# Patient Record
Sex: Female | Born: 1995 | Race: Black or African American | Hispanic: No | Marital: Single | Smoking: Never smoker
Health system: Southern US, Community
[De-identification: ages and names within clinical notes are randomized; demographics above are authoritative.]

## PROBLEM LIST (undated history)

## (undated) DIAGNOSIS — Z91013 Allergy to seafood: Secondary | ICD-10-CM

## (undated) DIAGNOSIS — J302 Other seasonal allergic rhinitis: Secondary | ICD-10-CM

## (undated) DIAGNOSIS — M940 Chondrocostal junction syndrome [Tietze]: Secondary | ICD-10-CM

## (undated) DIAGNOSIS — F40243 Fear of flying: Secondary | ICD-10-CM

## (undated) HISTORY — DX: Allergy to seafood: Z91.013

## (undated) HISTORY — DX: Chondrocostal junction syndrome (tietze): M94.0

## (undated) HISTORY — DX: Fear of flying: F40.243

## (undated) HISTORY — DX: Other seasonal allergic rhinitis: J30.2

---
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
## 2015-12-16 ENCOUNTER — Ambulatory Visit (INDEPENDENT_AMBULATORY_CARE_PROVIDER_SITE_OTHER): Payer: Managed Care, Other (non HMO) | Admitting: Family Medicine

## 2015-12-16 ENCOUNTER — Encounter: Payer: Self-pay | Admitting: Family Medicine

## 2015-12-16 VITALS — BP 130/100 | HR 82 | Wt 209.0 lb

## 2015-12-16 DIAGNOSIS — S61012A Laceration without foreign body of left thumb without damage to nail, initial encounter: Secondary | ICD-10-CM

## 2015-12-16 DIAGNOSIS — Z23 Encounter for immunization: Secondary | ICD-10-CM

## 2015-12-16 NOTE — Progress Notes (Signed)
   Subjective:    Patient ID: Danley DankerAyanna Arrick, female    DOB: May 05, 1995, 20 y.o.   MRN: [id number]  HPI She sustained a slight laceration to her left thumb around noon today. She is here for further evaluation of this. She did clean it several times with soap and water as well as peroxide.  Review of Systems     Objective:   Physical Exam The left thumb distally on the palmar surface shows a 1-1/2 cm laceration with the edges well opposed.       Assessment & Plan:  Laceration of left thumb without foreign body without damage to nail, initial encounter  Need for prophylactic vaccination with combined diphtheria-tetanus-pertussis (DTP) vaccine - Plan: Tdap vaccine greater than or equal to 7yo IM  A Band-Aid was applied. She will be given a tetanus shot. No further intervention necessary.

## 2016-02-02 ENCOUNTER — Encounter: Payer: Self-pay | Admitting: Medical

## 2016-02-02 ENCOUNTER — Ambulatory Visit (INDEPENDENT_AMBULATORY_CARE_PROVIDER_SITE_OTHER): Payer: Managed Care, Other (non HMO) | Admitting: Medical

## 2016-02-02 VITALS — BP 116/70 | HR 98 | Temp 99.0°F | Wt 209.8 lb

## 2016-02-02 DIAGNOSIS — N926 Irregular menstruation, unspecified: Secondary | ICD-10-CM | POA: Diagnosis not present

## 2016-02-02 DIAGNOSIS — J019 Acute sinusitis, unspecified: Secondary | ICD-10-CM | POA: Diagnosis not present

## 2016-02-02 DIAGNOSIS — K219 Gastro-esophageal reflux disease without esophagitis: Secondary | ICD-10-CM

## 2016-02-02 MED ORDER — FAMOTIDINE 40 MG PO TABS: 40.0000 mg | ORAL_TABLET | Freq: Every day | ORAL | 0 refills | Status: DC

## 2016-02-02 MED ORDER — AMOXICILLIN 500 MG PO TABS: ORAL_TABLET | ORAL | 0 refills | Status: DC

## 2016-02-02 NOTE — Progress Notes (Signed)
Subjective: Chief Complaint  Patient presents with  . sinus    sinus , no fever, no coughing , acid relex , has had period in 2 months   Here for several concerns.  She notes several week hx/o sinus drainage, headache.  Has some throat.  Using some dayquil and Nyquil.   +sick contact.   No fever.  No vomiting, no diarrhea.  No cough.  Nothing will come out of head.     Hasn't had period in 2 months.  Had home pregnancy test twice in October, and one in first week of November, all negative  Lately feeling reflux in throat at night when lying flat.   Using nothing for this.   Uses no particular diet discretion.   Last week was thanksgiving.     On no medications, no birth control.   Last birth control 742015.   Sexually active.   Always using condoms.   No prior pregnancy.  First started having periods age 20yo.  Since getting off birth control 2015 periods have been quite irregular.   Periods are sometimes heavy, but not lately.   No family hx/o gynecological problems.   She denies hx/o cysts of fibroids.   Last pap smear never.  No vaginal discharge.    No bowel issues.  Past Medical History:  Diagnosis Date  . Costochondritis 2014  . Seasonal allergies    pollen, mold  . Shellfish allergy    No current outpatient prescriptions on file prior to visit.   No current facility-administered medications on file prior to visit.    ROS as in subjective   Objective: BP 116/70   Pulse 98   Temp 99 F (37.2 C)   Wt 209 lb 12.8 oz (95.2 kg)   SpO2 99%   BMI 37.16 kg/m   General appearance: Alert, WD/WN, no distress                             Skin: warm, no rash                           Head: + frontal sinus tenderness,                            Eyes: conjunctiva normal, corneas clear, PERRLA                            Ears: pearly TMs, external ear canals normal                          Nose: septum midline, turbinates swollen, with erythema and clear discharge  Mouth/throat: MMM, tongue normal, mild pharyngeal erythema                           Neck: supple, no adenopathy, no thyromegaly, non tender                          Heart: RRR, normal S1, S2, no murmurs                         Lungs: CTA bilaterally, no wheezes, rales, or rhonchi    abdomen: +bs, soft, nontender, no mass, no  organomegaly Back: nontender     Assessment: Encounter Diagnoses  Name Primary?  . Acute non-recurrent sinusitis, unspecified location Yes  . Gastroesophageal reflux disease without esophagitis   . Irregular periods      Plan: Sinusitis - discussed supportive care, begin amoxicillin, can c/t dayquil/Nyquil, and if not much improved by end of the week, then recheck GERD- avoid GERD triggers, begin trial of Famotidine, and recheck if not resolved within 2 weeks Irregular periods - urine pregnancy negative.  Discussed possible causes, evaluation recommended.  She will return for physical, consideration of contraception, and eval for irregular periods (labs, exam, ultrasound)  Leena was seen today for sinus.  Diagnoses and all orders for this visit:  Acute non-recurrent sinusitis, unspecified location  Gastroesophageal reflux disease without esophagitis  Irregular periods  Other orders -     amoxicillin (AMOXIL) 500 MG tablet; 2 tablets po BID x 10 days -     famotidine (PEPCID) 40 MG tablet; Take 1 tablet (40 mg total) by mouth daily.

## 2016-02-02 NOTE — Patient Instructions (Signed)
Contraception Choices Birth control (contraception) is the use of any methods or devices to stop pregnancy from happening. Below are some methods to help avoid pregnancy. Hormonal birth control  A small tube put under the skin of the upper arm (implant). The tube can stay in place for 3 years. The implant must be taken out after 3 years.  Shots given every 3 months.  Pills taken every day.  Patches that are changed once a week.  A ring put into the vagina (vaginal ring). The ring is left in place for 3 weeks and removed for 1 week. Then, a new ring is put in the vagina.  Emergency birth control pills taken after unprotected sex (intercourse). Barrier birth control  A thin covering worn on the penis (female condom) during sex.  A soft, loose covering put into the vagina (female condom) before sex.  A rubber bowl that sits over the cervix (diaphragm). The bowl must be made for you. The bowl is put into the vagina before sex. The bowl is left in place for 6 to 8 hours after sex.  A small, soft cup that fits over the cervix (cervical cap). The cup must be made for you. The cup can be left in place for 48 hours after sex.  A sponge that is put into the vagina before sex.  A chemical that kills or stops sperm from getting into the cervix and uterus (spermicide). The chemical may be a cream, jelly, foam, or pill. Intrauterine (IUD) birth control  IUD birth control is a small, T-shaped piece of plastic. The plastic is put inside the uterus. There are 2 types of IUD:  Copper IUD. The IUD is covered in copper wire. The copper makes a fluid that kills sperm. It can stay in place for 10 years.  Hormone IUD. The hormone stops pregnancy from happening. It can stay in place for 5 years. Permanent methods  When the woman has her fallopian tubes sealed, tied, or blocked during surgery. This stops the egg from traveling to the uterus.  The doctor places a small coil or insert into each fallopian  tube. This causes scar tissue to form and blocks the fallopian tubes.  When the female has the tubes that carry sperm tied off (vasectomy). Natural family planning birth control  Natural family planning means not having sex or using barrier birth control on the days the woman could become pregnant.  Use a calendar to keep track of the length of each period and know the days she can get pregnant.  Avoid sex during ovulation.  Use a thermometer to measure body temperature. Also watch for symptoms of ovulation.  Time sex to be after the woman has ovulated. Use condoms to help protect yourself against sexually transmitted infections (STIs). Do this no matter what type of birth control you use. Talk to your doctor about which type of birth control is best for you. This information is not intended to replace advice given to you by your health care provider. Make sure you discuss any questions you have with your health care provider. Document Released: 12/20/2008 Document Revised: 07/31/2015 Document Reviewed: 09/13/2012 Elsevier Interactive Patient Education  2017 Elsevier Inc.     Gastroesophageal Reflux Disease, Adult Gastroesophageal reflux disease (GERD) happens when acid from your stomach flows up into the esophagus. When acid comes in contact with the esophagus, the acid causes soreness (inflammation) in the esophagus. Over time, GERD may create small holes (ulcers) in the lining of the  esophagus. CAUSES   Increased body weight. This puts pressure on the stomach, making acid rise from the stomach into the esophagus.   Smoking. This increases acid production in the stomach.   Drinking alcohol. This causes decreased pressure in the lower esophageal sphincter (valve or ring of muscle between the esophagus and stomach), allowing acid from the stomach into the esophagus.   Late evening meals and a full stomach. This increases pressure and acid production in the stomach.   A malformed  lower esophageal sphincter.  Sometimes, no cause is found. SYMPTOMS   Burning pain in the lower part of the mid-chest behind the breastbone and in the mid-stomach area. This may occur twice a week or more often.   Trouble swallowing.   Sore throat.   Dry cough.   Asthma-like symptoms including chest tightness, shortness of breath, or wheezing.  DIAGNOSIS  Your caregiver may be able to diagnose GERD based on your symptoms. In some cases, X-rays and other tests may be done to check for complications or to check the condition of your stomach and esophagus. TREATMENT  Your caregiver may recommend over-the-counter or prescription medicines to help decrease acid production. Ask your caregiver before starting or adding any new medicines.  HOME CARE INSTRUCTIONS   Change the factors that you can control. Ask your caregiver for guidance concerning weight loss, quitting smoking, and alcohol consumption.   Avoid foods and drinks that make your symptoms worse, such as:   Caffeine or alcoholic drinks.   Chocolate.   Peppermint or mint flavorings.   Garlic and onions.   Spicy foods.   Citrus fruits, such as oranges, lemons, or limes.   Tomato-based foods such as sauce, chili, salsa, and pizza.   Fried and fatty foods.   Avoid lying down for the 3 hours prior to your bedtime or prior to taking a nap.   Eat small, frequent meals instead of large meals.   Wear loose-fitting clothing. Do not wear anything tight around your waist that causes pressure on your stomach.   Raise the head of your bed 6 to 8 inches with wood blocks to help you sleep. Extra pillows will not help.   Only take over-the-counter or prescription medicines for pain, discomfort, or fever as directed by your caregiver.   Do not take aspirin, ibuprofen, or other nonsteroidal anti-inflammatory drugs (NSAIDs).  SEEK IMMEDIATE MEDICAL CARE IF:   You have pain in your arms, neck, jaw, teeth, or back.   Your pain  increases or changes in intensity or duration.   You develop nausea, vomiting, or sweating (diaphoresis).   You develop shortness of breath, or you faint.   Your vomit is green, yellow, black, or looks like coffee grounds or blood.   Your stool is red, bloody, or black.  These symptoms could be signs of other problems, such as heart disease, gastric bleeding, or esophageal bleeding. MAKE SURE YOU:   Understand these instructions.   Will watch your condition.   Will get help right away if you are not doing well or get worse.  Document Released: 12/02/2004 Document Revised: 11/04/2010 Document Reviewed: 09/11/2010 [facility]ExitCare Patient Information 2012 [place].

## 2016-05-11 ENCOUNTER — Ambulatory Visit (INDEPENDENT_AMBULATORY_CARE_PROVIDER_SITE_OTHER): Payer: Managed Care, Other (non HMO) | Admitting: Medical

## 2016-05-11 VITALS — BP 122/70 | HR 112 | Temp 99.7°F | Wt 212.0 lb

## 2016-05-11 DIAGNOSIS — R6889 Other general symptoms and signs: Secondary | ICD-10-CM

## 2016-05-11 DIAGNOSIS — R109 Unspecified abdominal pain: Secondary | ICD-10-CM | POA: Diagnosis not present

## 2016-05-11 DIAGNOSIS — R000 Tachycardia, unspecified: Secondary | ICD-10-CM

## 2016-05-11 DIAGNOSIS — R52 Pain, unspecified: Secondary | ICD-10-CM

## 2016-05-11 DIAGNOSIS — R110 Nausea: Secondary | ICD-10-CM | POA: Diagnosis not present

## 2016-05-11 DIAGNOSIS — T8859XA Other complications of anesthesia, initial encounter: Secondary | ICD-10-CM

## 2016-05-11 LAB — POCT URINALYSIS DIPSTICK
BILIRUBIN UA: NEGATIVE
Blood, UA: NEGATIVE
Glucose, UA: NEGATIVE
Ketones, UA: NEGATIVE
Leukocytes, UA: NEGATIVE
Nitrite, UA: NEGATIVE
Spec Grav, UA: >=1.03
UROBILINOGEN UA: NEGATIVE
pH, UA: 6

## 2016-05-11 LAB — POC INFLUENZA A&B (BINAX/QUICKVUE)
Influenza A, POC: NEGATIVE
Influenza B, POC: NEGATIVE

## 2016-05-11 MED ORDER — OSELTAMIVIR PHOSPHATE 75 MG PO CAPS: 75.0000 mg | ORAL_CAPSULE | Freq: Two times a day (BID) | ORAL | 0 refills | Status: DC

## 2016-05-11 MED ORDER — ONDANSETRON HCL 4 MG PO TABS: 4.0000 mg | ORAL_TABLET | Freq: Three times a day (TID) | ORAL | 0 refills | Status: DC | PRN

## 2016-05-11 NOTE — Progress Notes (Signed)
  Subjective:  Deborah Woods is a 21 y.o. female who presents for illness.  Symptoms include: fever and body aches, arms, legs, stomach, back hurt, has had some nausea, looes stool once, some sore throat mild, 1 episode of vomiting. Onset of symptoms was several hours ago, and have been gradually worsening since that time. Associated symptoms include: none.  Patient denies: sinus pressure and cough, no urinary symptoms, no ear pain, no rash. She is not drinking much.  Treatment to date: none.  no sick contacts.  No other aggravating or relieving factors.  No other c/o.  The following portions of the patient's history were reviewed and updated as appropriate: allergies, current medications, past medical history, past social history and problem list.  ROS as in subjective   Past Medical History:  Diagnosis Date  . Costochondritis 2014  . Seasonal allergies    pollen, mold  . Shellfish allergy      Objective: BP 122/70   Pulse (!) 112   Temp 99.7 F (37.6 C)   Wt 212 lb (96.2 kg)   SpO2 97%   BMI 37.55 kg/m   General: Ill-appearing, well-developed, well-nourished Skin: Hot, dry HEENT: Nose inflamed and congested, clear conjunctiva, TMs pearly, no sinus tenderness, pharynx with erythema, 2-3+bilat tonsils, no exudates Neck: Supple, non tender, shotty cervical adenopathy Heart: Regular rate and rhythm, normal S1, S2, no murmurs Lungs: Clear to auscultation bilaterally, no wheezes, rales, rhonchi Abdomen: Non tender non distended Extremities: Mild generalized tenderness    Assessment: Encounter Diagnoses  Name Primary?  . Flu-like symptoms Yes  . Abdominal discomfort   . Nausea after anesthesia, initial encounter   . Tachycardia   . Body aches      Plan: discussed symptoms, exam, UA results.  She refused strep test.   Begin empiric treatment for likely flu.     Prescription given for Tamiflu, discussed risks/benefits of medication.    Discussed diagnosis of influenza.  Discussed supportive care including rest, hydration, OTC Tylenol or NSAID for fever, aches, and malaise.  Discussed period of contagion, self quarantine at home away from others to avoid spread of disease, discussed means of transmission, and possible complications including pneumonia.  If worse or not improving within the next 4-5 days, then call or return.  Otherwise call report tomorrow.  Patient voiced understanding of diagnosis, recommendations, and treatment plan.  After visit summary given.  Gave note for work.  Deborah Woods was seen today for possible flu.  Diagnoses and all orders for this visit:  Flu-like symptoms -     POC Influenza A&B(BINAX/QUICKVUE) -     Urinalysis Dipstick  Abdominal discomfort  Nausea after anesthesia, initial encounter  Tachycardia  Body aches  Other orders -     oseltamivir (TAMIFLU) 75 MG capsule; Take 1 capsule (75 mg total) by mouth 2 (two) times daily. -     ondansetron (ZOFRAN) 4 MG tablet; Take 1 tablet (4 mg total) by mouth every 8 (eight) hours as needed for nausea or vomiting.

## 2016-05-12 ENCOUNTER — Other Ambulatory Visit: Payer: Self-pay | Admitting: Medical

## 2016-05-12 ENCOUNTER — Telehealth: Payer: Self-pay | Admitting: Medical

## 2016-05-12 MED ORDER — AMOXICILLIN 500 MG PO TABS: 500.0000 mg | ORAL_TABLET | Freq: Three times a day (TID) | ORAL | 0 refills | Status: DC

## 2016-05-12 NOTE — Telephone Encounter (Signed)
Pt called and stated that she is feeling a little better. She states that her throat is extremely sore now. She is running some fever. Please advise pt at [phone]. Pt uses [facility].

## 2016-05-12 NOTE — Telephone Encounter (Signed)
I was worried about strep given how her throat was looking.  Have her begin Amoxicillin tablet TID for strep throat, rest, hydrate well.    Can use tylenol or ibuprofen for fever, aches, chills.   Can use salt water gargles, warm fluids, chloraseptic spray OTC for throat pain.     Contagious for the next few days.    We may need to extend work note through Thursday or Friday depending upon the prior work note.   Have her call back with symptoms update on Friday

## 2016-05-13 NOTE — Telephone Encounter (Signed)
Called spoke with pt about this , as her to call us back if she didn't feel any better

## 2016-05-21 ENCOUNTER — Institutional Professional Consult (permissible substitution): Payer: Managed Care, Other (non HMO) | Admitting: Medical

## 2016-10-22 ENCOUNTER — Encounter: Payer: Self-pay | Admitting: Medical

## 2016-10-22 ENCOUNTER — Ambulatory Visit (INDEPENDENT_AMBULATORY_CARE_PROVIDER_SITE_OTHER): Payer: Managed Care, Other (non HMO) | Admitting: Medical

## 2016-10-22 VITALS — BP 142/84 | HR 114 | Temp 98.8°F | Wt 217.2 lb

## 2016-10-22 DIAGNOSIS — R030 Elevated blood-pressure reading, without diagnosis of hypertension: Secondary | ICD-10-CM

## 2016-10-22 DIAGNOSIS — J069 Acute upper respiratory infection, unspecified: Secondary | ICD-10-CM | POA: Diagnosis not present

## 2016-10-22 DIAGNOSIS — R000 Tachycardia, unspecified: Secondary | ICD-10-CM

## 2016-10-22 NOTE — Progress Notes (Signed)
Subjective:  Deborah Woods is a 21 y.o. female who presents for possible sinus infection.  Has had several day hx/o sickness, stuffy nose, stuffy head, rare cough, sore throat, but worse throat yesterday.  Some ear soreness, some sinus pains.  No fever, no NVD.  No specific sick contacts, but just rode bus back from [place] last weekend.  Using Dayquil and Nyquil, drinking EmernenC for symptoms.  Patient is not a smoker.   Pulse noted to be elevated.  Doesn't drink a lot of caffeine.   No drug use.  Limited alcohol.   No other aggravating or relieving factors.  No other c/o.  Past Medical History:  Diagnosis Date  . Costochondritis 2014  . Seasonal allergies    pollen, mold  . Shellfish allergy    Current Outpatient Prescriptions on File Prior to Visit  Medication Sig Dispense Refill  . Norethin Ace-Eth Estrad-FE (MIBELAS 24 FE) 1-20 MG-MCG(24) CHEW Chew by mouth.     No current facility-administered medications on file prior to visit.    Family History  Problem Relation Age of Onset  . Heart disease Neg Hx   . Hypertension Neg Hx     ROS as in subjective     Objective: BP (!) 142/84   Pulse (!) 114   Temp 98.8 F (37.1 C)   Wt 217 lb 3.2 oz (98.5 kg)   SpO2 97%   BMI 38.48 kg/m   General appearance: Alert, WD/WN, no distress                             Skin: warm, no rash                           Head: no sinus tenderness,                            Eyes: conjunctiva normal, corneas clear, PERRLA                            Ears: flat TMs, external ear canals normal                          Nose: septum midline, turbinates swollen, with erythema and clear discharge             Mouth/throat: MMM, tongue normal, no pharyngeal erythema                           Neck: supple, no adenopathy, no thyromegaly, non tender                          Heart: tachycardia, otherwise RRR, normal S1, S2, no murmurs                         Lungs: CTA bilaterally, no wheezes, rales, or  rhonchi       Assessment  Encounter Diagnoses  Name Primary?  . Acute upper respiratory infection Yes  . Elevated blood-pressure reading, without diagnosis of hypertension   . Tachycardia       Plan: Discussed symptome, exam findings .  recommendation below, but advised recheck 1-2 weeks on BP, pulse.  I suspect currently findings due to  need for better hydration and also using decongestants OTC  Recommendations:  Avoid over the counter decongestant like sudafed, pseudoephedrine, phenylephrine, as they can increase blood pressure and pulse  significantly increase your water intake in general, and particularly now while sick  Rest this weekend  Try using Mucinex DM or just plain Benadryl for symptoms  If fever >101, much worse over the weekend, then call after hours line or get rechecked.    Your current exam findings and symptoms suggest a cold.  Shenita was seen today for nasal congestion.  Diagnoses and all orders for this visit:  Acute upper respiratory infection  Elevated blood-pressure reading, without diagnosis of hypertension  Tachycardia    Patient was advised to call or return if worse or not improving in the next few days.    Patient voiced understanding of diagnosis, recommendations, and treatment plan.  After visit summary given.

## 2016-10-22 NOTE — Patient Instructions (Signed)
Recommendations:  Avoid over the counter decongestant like sudafed, pseudoephedrine, phenylephrine, as they can increase blood pressure and pulse  significantly increase your water intake in general, and particularly now while sick  Rest this weekend  Try using Mucinex DM or just plain Benadryl for symptoms  If fever >101, much worse over the weekend, then call after hours line or get rechecked.    Your current exam findings and symptoms suggest a cold.     I have included other useful information below for your review.   Upper Respiratory Infection, Adult An upper respiratory infection (URI) is also known as the common cold. It is often caused by a type of germ (virus). Colds are easily spread (contagious). You can pass it to others by kissing, coughing, sneezing, or drinking out of the same glass. Usually, you get better in 1 or 2 weeks.  HOME CARE   Only take medicine as told by your doctor.   Use a warm mist humidifier or breathe in steam from a hot shower.   Drink enough water and fluids to keep your pee (urine) clear or pale yellow.   Get plenty of rest.   Return to work when your temperature is back to normal or as told by your doctor. You may use a face mask and wash your hands to stop your cold from spreading.  GET HELP RIGHT AWAY IF:   After the first few days, you feel you are getting worse.   You have questions about your medicine.   You have chills, shortness of breath, or brown or red spit (mucus).   You have yellow or brown snot (nasal discharge) or pain in the face, especially when you bend forward.   You have a fever, puffy (swollen) neck, pain when you swallow, or white spots in the back of your throat.   You have a bad headache, ear pain, sinus pain, or chest pain.   You have a high-pitched whistling sound when you breathe in and out (wheezing).   You have a lasting cough or cough up blood.   You have sore muscles or a stiff neck.  MAKE SURE YOU:    Understand these instructions.   Will watch your condition.   Will get help right away if you are not doing well or get worse.  Document Released: 08/11/2007 Document Revised: 11/04/2010 Document Reviewed: 06/29/2010 [facility] Patient Information 2012 [place], [place].

## 2016-11-01 ENCOUNTER — Encounter: Payer: Self-pay | Admitting: Family Medicine

## 2016-11-01 ENCOUNTER — Ambulatory Visit (INDEPENDENT_AMBULATORY_CARE_PROVIDER_SITE_OTHER): Payer: Managed Care, Other (non HMO) | Admitting: Family Medicine

## 2016-11-01 VITALS — BP 120/70 | HR 72 | Temp 98.6°F | Resp 16 | Wt 218.0 lb

## 2016-11-01 DIAGNOSIS — H6121 Impacted cerumen, right ear: Secondary | ICD-10-CM

## 2016-11-01 DIAGNOSIS — H6691 Otitis media, unspecified, right ear: Secondary | ICD-10-CM

## 2016-11-01 DIAGNOSIS — J069 Acute upper respiratory infection, unspecified: Secondary | ICD-10-CM

## 2016-11-01 DIAGNOSIS — H6120 Impacted cerumen, unspecified ear: Secondary | ICD-10-CM | POA: Diagnosis not present

## 2016-11-01 MED ORDER — AMOXICILLIN 875 MG PO TABS: 875.0000 mg | ORAL_TABLET | Freq: Two times a day (BID) | ORAL | 0 refills | Status: DC

## 2016-11-01 NOTE — Progress Notes (Signed)
Subjective: Chief Complaint  Patient presents with  . Allergies    Allergies and ear pain     Deborah Woods is a 21 y.o. female who presents for a 2 week history of stuffy nose and a 1 week history of right ear with pressure.  Denies fever, chills, sore throat, cough, chest pain, abdominal pain, N/V/D.   Seasonal allergies. Does not smoke.  Antibiotics in June for a cyst.  No history of asthma or bronchitis.   Treatment to date: mucinex, zyrtec.  Denies sick contacts.  No other aggravating or relieving factors.  No other c/o.  ROS as in subjective.   Objective: Vitals:   11/01/16 1328  BP: 120/70  Pulse: 72  Resp: 16  Temp: 98.6 F (37 C)  SpO2: 98%    General appearance: Alert, WD/WN, no distress, well appearing                             Skin: warm, no rash                           Head: no sinus tenderness                            Eyes: conjunctiva normal, corneas clear, PERRLA                            Ears: pearly left TM and right TM is dull and erythematous, external ear canals normal post right lavage.                           Nose: septum midline, turbinates swollen, with erythema and clear discharge             Mouth/throat: MMM, tongue normal, mild pharyngeal erythema                           Neck: supple, no adenopathy, no thyromegaly, nontender                          Heart: RRR, normal S1, S2, no murmurs                         Lungs: CTA bilaterally, no wheezes, rales, or rhonchi      Assessment: Acute URI  Hearing loss due to cerumen impaction, right  Acute otitis media, right - Plan: amoxicillin (AMOXIL) 875 MG tablet    Plan: Discussed diagnosis and treatment of acute otitis media and URI. Right ear lavage done for cerumen impaction and she tolerated this well. Post lavage revealed acute otitis media. Amoxicillin prescribed.  Suggested symptomatic OTC remedies.Nasal saline spray for congestion.  Tylenol or Ibuprofen OTC for fever and  malaise.  Call/return if worse or not back to baseline after completing the antibiotic.

## 2016-12-16 ENCOUNTER — Encounter: Payer: Self-pay | Admitting: Family Medicine

## 2016-12-16 ENCOUNTER — Ambulatory Visit (INDEPENDENT_AMBULATORY_CARE_PROVIDER_SITE_OTHER): Payer: Managed Care, Other (non HMO) | Admitting: Family Medicine

## 2016-12-16 VITALS — BP 130/80 | HR 90 | Wt 223.0 lb

## 2016-12-16 DIAGNOSIS — F40243 Fear of flying: Secondary | ICD-10-CM

## 2016-12-16 DIAGNOSIS — F418 Other specified anxiety disorders: Secondary | ICD-10-CM

## 2016-12-16 HISTORY — DX: Fear of flying: F40.243

## 2016-12-16 MED ORDER — ALPRAZOLAM 0.25 MG PO TABS: 0.2500 mg | ORAL_TABLET | Freq: Two times a day (BID) | ORAL | 0 refills | Status: AC | PRN

## 2016-12-16 NOTE — Patient Instructions (Addendum)
Do not drink alcohol or drive when taking the medication. It may make you drowsy.   Check your pulse as demonstrated when you are not feeling anxious or stressed and if your resting heart rate is > 100 consistently, follow up with Deborah Woods your PCP.    You can call to schedule your appointment with the Counselor. A couple offices are listed below for you to call.    [facility]  [address], [place], [place] [address]  Phone: [phone]  [facility] [address] [place], [place] [address]  Phone: [phone]  [facility]  [address] [place], [place] [address] Phone: [phone]

## 2016-12-16 NOTE — Progress Notes (Signed)
   Subjective:    Patient ID: Deborah Woods, female    DOB: 1995-07-28, 21 y.o.   MRN: [id number]  HPI Chief Complaint  Patient presents with  . follow-up    follow-up on heartrate cause per pt the last couple time here heartrate was elevated   She is here with concerns about her heart rate being elevated. States she thinks it is related to anxiety. States she has to fly on Monday and is very anxious about this. Has a fear of flying. Has been seeing a counselor in school but states she would like to see someone outside of her college.    Denies irregular heart rhythm, chest pain, cough, shortness of breath, abdominal pain, back pain, N/V/D, LE edema.   Reviewed allergies, medications, past medical, surgical, and social history.    Review of Systems Pertinent positives and negatives in the history of present illness.     Objective:   Physical Exam BP 130/80   Pulse 90   Wt 223 lb (101.2 kg)   BMI 39.50 kg/m  Alert and in no distress.  Pharyngeal area is normal. Neck is supple without adenopathy or thyromegaly. Cardiac exam shows a regular sinus rhythm without murmurs or gallops. Lungs are clear to auscultation.      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 Discussed possible side effects and precautions with Xanax. She is aware that this is short term treatment and she should get in to see a counselor. Had her do a return demonstration on how to check her pulse. Recommend she check it at rest and see how fast it is and if it is regular or not.  Discussed that if her heart rate is elevated on a regular basis then she should follow up with Kristian Covey, her PCP.

## 2017-01-24 ENCOUNTER — Encounter (HOSPITAL_COMMUNITY): Payer: Self-pay | Admitting: *Deleted

## 2017-01-24 ENCOUNTER — Emergency Department (HOSPITAL_COMMUNITY): Payer: Managed Care, Other (non HMO)

## 2017-01-24 DIAGNOSIS — R0789 Other chest pain: Secondary | ICD-10-CM | POA: Diagnosis present

## 2017-01-24 DIAGNOSIS — Z79899 Other long term (current) drug therapy: Secondary | ICD-10-CM | POA: Insufficient documentation

## 2017-01-24 NOTE — ED Triage Notes (Signed)
Pt states that she has a discomfort in the right chest and just under bilateral breast when she takes a deep breath. She says has had a cold recently with a cough. Denies fevers. Reports has had costochondritis previously and feels similar. No meds just PTA

## 2017-01-25 ENCOUNTER — Other Ambulatory Visit: Payer: Self-pay

## 2017-01-25 ENCOUNTER — Emergency Department (HOSPITAL_COMMUNITY)
Admission: EM | Admit: 2017-01-25 | Discharge: 2017-01-25 | Disposition: A | Discharge status: Home / Self Care | Payer: Managed Care, Other (non HMO) | Attending: Emergency Medicine | Admitting: Emergency Medicine

## 2017-01-25 DIAGNOSIS — R0789 Other chest pain: Secondary | ICD-10-CM

## 2017-01-25 LAB — I-STAT CHEM 8, ED
BUN: 5 mg/dL — ABNORMAL LOW (ref 6–20)
CALCIUM ION: 1.24 mmol/L (ref 1.15–1.40)
Chloride: 104 mmol/L (ref 101–111)
Creatinine, Ser: 0.7 mg/dL (ref 0.44–1.00)
Glucose, Bld: 99 mg/dL (ref 65–99)
HCT: 39 % (ref 36.0–46.0)
Hemoglobin: 13.3 g/dL (ref 12.0–15.0)
Potassium: 3.6 mmol/L (ref 3.5–5.1)
SODIUM: 140 mmol/L (ref 135–145)
TCO2: 27 mmol/L (ref 22–32)

## 2017-01-25 LAB — CBC WITH DIFFERENTIAL/PLATELET
BASOS PCT: 1 %
Basophils Absolute: 0 10*3/uL (ref 0.0–0.1)
EOS ABS: 0.2 10*3/uL (ref 0.0–0.7)
Eosinophils Relative: 3 %
HCT: 36.8 % (ref 36.0–46.0)
HEMOGLOBIN: 11.7 g/dL — AB (ref 12.0–15.0)
Lymphocytes Relative: 48 %
Lymphs Abs: 3.3 10*3/uL (ref 0.7–4.0)
MCH: 25.4 pg — ABNORMAL LOW (ref 26.0–34.0)
MCHC: 31.8 g/dL (ref 30.0–36.0)
MCV: 79.8 fL (ref 78.0–100.0)
Monocytes Absolute: 0.4 10*3/uL (ref 0.1–1.0)
Monocytes Relative: 6 %
Neutro Abs: 2.9 10*3/uL (ref 1.7–7.7)
Neutrophils Relative %: 42 %
Platelets: 414 10*3/uL — ABNORMAL HIGH (ref 150–400)
RBC: 4.61 MIL/uL (ref 3.87–5.11)
RDW: 13.5 % (ref 11.5–15.5)
WBC: 6.9 10*3/uL (ref 4.0–10.5)

## 2017-01-25 LAB — D-DIMER, QUANTITATIVE (NOT AT ARMC)

## 2017-01-25 LAB — I-STAT BETA HCG BLOOD, ED (MC, WL, AP ONLY): I-stat hCG, quantitative: <5 m[IU]/mL (ref <5)

## 2017-01-25 MED ORDER — TRAMADOL HCL 50 MG PO TABS: 50.0000 mg | ORAL_TABLET | Freq: Four times a day (QID) | ORAL | 0 refills | Status: AC | PRN

## 2017-01-25 MED ORDER — MELOXICAM 15 MG PO TABS: 15.0000 mg | ORAL_TABLET | Freq: Every day | ORAL | 0 refills | Status: AC

## 2017-01-25 MED ORDER — KETOROLAC TROMETHAMINE 30 MG/ML IJ SOLN
30.0000 mg | Freq: Once | INTRAMUSCULAR | Status: AC
  Administered 2017-01-25: 30 mg via INTRAVENOUS
  Filled 2017-01-25: qty 1

## 2017-01-25 NOTE — ED Provider Notes (Signed)
[facility]-EMERGENCY DEPT Provider Note   CSN: [id number] Arrival date & time: 01/24/17  2240     History   Chief Complaint Chief Complaint  Patient presents with  . Chest Pain    HPI Deborah Woods is a 21 y.o. female.  HPI Deborah Woods is a 21 y.o. female presents to emergency department complaining of chest pain.  Patient states that she has developed chest pain approximately 4 days ago.  Pain is constant, comes and goes in severity.  She states pain is worse with movement and deep breathing.  She reports that she has had a cough over the last few weeks but it is getting better.  She states cough is nonproductive.  She denies any hemoptysis.  No fever or chills.  Reports some nausea no vomiting.  No abdominal pain.  She reports similar symptoms several years ago and was diagnosed with costochondritis.  She has taken Tylenol for pain and Robitussin for her cough.  She reports some improvement with those medications.  Past Medical History:  Diagnosis Date  . Costochondritis 2014  . 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 Seasonal allergies    pollen, mold  . Shellfish allergy     Patient Active Problem List   Diagnosis Date Noted  . 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 Acute non-recurrent sinusitis 02/02/2016  . Gastroesophageal reflux disease without esophagitis 02/02/2016  . Irregular periods 02/02/2016    History reviewed. No pertinent surgical history.  OB History    No data available       Home Medications    Prior to Admission medications   Medication Sig Start Date End Date Taking? Authorizing Provider  ALPRAZolam (XANAX) 0.25 MG tablet Take 1 tablet (0.25 mg total) by mouth 2 (two) times daily as needed for anxiety. 12/16/16   Henson, Vickie L, NP-C  TRI-PREVIFEM 0.18/0.215/0.25 MG-35 MCG tablet Take 1 tablet by mouth daily. 10/04/16   [provider]    Family History Family History  Problem Relation Age of Onset  . Heart disease Neg Hx     . Hypertension Neg Hx     Social History Social History   Tobacco Use  . Smoking status: Never Smoker  . Smokeless tobacco: Never Used  Substance Use Topics  . Alcohol use: No  . Drug use: No     Allergies   Shellfish allergy   Review of Systems Review of Systems  Constitutional: Negative for chills and fever.  Respiratory: Positive for cough, chest tightness and shortness of breath.   Cardiovascular: Positive for chest pain. Negative for palpitations and leg swelling.  Gastrointestinal: Negative for abdominal pain, diarrhea, nausea and vomiting.  Genitourinary: Negative for dysuria, flank pain and pelvic pain.  Musculoskeletal: Negative for arthralgias, myalgias, neck pain and neck stiffness.  Skin: Negative for rash.  Neurological: Negative for dizziness, weakness and headaches.  All other systems reviewed and are negative.    Physical Exam Updated Vital Signs BP (!) 139/93   Pulse 92   Temp 98.1 F (36.7 C) (Oral)   Resp 20   LMP 01/03/2017   SpO2 100%   Physical Exam  Constitutional: She appears well-developed and well-nourished. No distress.  HENT:  Head: Normocephalic.  Eyes: Conjunctivae are normal.  Neck: Neck supple.  Cardiovascular: Normal rate, regular rhythm and normal heart sounds.  Pulmonary/Chest: Effort normal and breath sounds normal. No respiratory distress. She has no wheezes. She has no rhonchi. She has no rales.  Diffuse tenderness to palpation  over chest.  Abdominal: Soft. Bowel sounds are normal. She exhibits no distension. There is no tenderness. There is no rebound.  Musculoskeletal: She exhibits no edema.  Neurological: She is alert.  Skin: Skin is warm and dry.  Psychiatric: She has a normal mood and affect. Her behavior is normal.  Nursing note and vitals reviewed.    ED Treatments / Results  Labs (all labs ordered are listed, but only abnormal results are displayed) Labs Reviewed  CBC WITH DIFFERENTIAL/PLATELET -  Abnormal; Notable for the following components:      Result Value   Hemoglobin 11.7 (*)    MCH 25.4 (*)    Platelets 414 (*)    All other components within normal limits  I-STAT CHEM 8, ED - Abnormal; Notable for the following components:   BUN 5 (*)    All other components within normal limits  D-DIMER, QUANTITATIVE (NOT AT [facility])  I-STAT BETA HCG BLOOD, ED ([facility], [facility], [facility] ONLY)    EKG  EKG Interpretation  Date/Time:  Tuesday January 25 2017 01:04:17 EST Ventricular Rate:  87 PR Interval:    QRS Duration: 72 QT Interval:  355 QTC Calculation: 427 R Axis:   80 Text Interpretation:  Sinus rhythm Confirmed by Nicanor AlconPalumbo, April ([id number]) on 01/25/2017 5:25:18 AM       Radiology Dg Chest 2 View  Result Date: 01/24/2017 CLINICAL DATA:  Upper and mid chest pain for 48 hours, patient states she has had a cough and cold for one week, shortness of breath EXAM: CHEST  2 VIEW COMPARISON:  None. FINDINGS: The heart size and mediastinal contours are within normal limits. Both lungs are clear. Calcified granuloma in the left base. The visualized skeletal structures are unremarkable. IMPRESSION: No active cardiopulmonary disease. Electronically Signed   By: Burman NievesWilliam  Stevens M.D.   On: 01/24/2017 23:54    Procedures Procedures (including critical care time)  Medications Ordered in ED Medications  ketorolac (TORADOL) 30 MG/ML injection 30 mg (not administered)     Initial Impression / Assessment and Plan / ED Course  I have reviewed the triage vital signs and the nursing notes.  Pertinent labs & imaging results that were available during my care of the patient were reviewed by me and considered in my medical decision making (see chart for details).     Patient emergency department with reproducible chest pain, that is worse with palpation and movement.  She also admits to recent cough.  She is on birth control, no recent travel or surgeries.  Symptoms are pleuritic.  He reports associated  shortness of breath.  Will get a d-dimer to rule out PE.  Otherwise negative chest x-ray and labs at this time.  Patient is not pregnant.  3:46 AM D-dimer is negative.  Vital signs remained normal.  Most likely costochondritis versus pleurisy.  Will discharge home with NSAIDs, tramadol for severe pain, follow-up with family doctor. Return precautions discussed.   Vitals:   01/25/17 0200 01/25/17 0230 01/25/17 0300 01/25/17 0337  BP: 109/65 100/74 121/86 111/65  Pulse: 88 91 90 87  Resp: 20 17 19 20   Temp:    98.6 F (37 C)  TempSrc:    Oral  SpO2: 100% 100% 100% 100%     Final Clinical Impressions(s) / ED Diagnoses   Final diagnoses:  Chest wall pain    ED Discharge Orders    None       Iona CoachKirichenko, Tanija Germani, PA-C 01/25/17 [id number]    Palumbo, April, MD 01/25/17  0733  

## 2017-01-25 NOTE — Discharge Instructions (Signed)
Take Mobic for pain and inflammation.  Take tramadol for severe pain as prescribed as needed.  Follow-up with family doctor for recheck.  Return if worsening symptoms.

## 2017-04-08 ENCOUNTER — Encounter: Payer: Self-pay | Admitting: Family Medicine

## 2017-04-08 ENCOUNTER — Ambulatory Visit (INDEPENDENT_AMBULATORY_CARE_PROVIDER_SITE_OTHER): Payer: Managed Care, Other (non HMO) | Admitting: Family Medicine

## 2017-04-08 VITALS — BP 118/72 | HR 103 | Wt 236.8 lb

## 2017-04-08 DIAGNOSIS — M461 Sacroiliitis, not elsewhere classified: Secondary | ICD-10-CM

## 2017-04-08 NOTE — Progress Notes (Signed)
   Subjective:    Patient ID: Deborah Woods, female    DOB: December 31, 1995, 22 y.o.   MRN: [id number]  HPI She complains of a several day history of left-sided back pain made worse with motion.  No history of injury.  No numbness, tingling, weakness, urinary symptoms.  She has been doing some stretching and Aleve with minimal relief of her symptoms.   Review of Systems     Objective:   Physical Exam Alert and in no distress.  Slight tenderness to palpation over the left upper SI joint.  Faber testing and stork test was positive.  Negative straight leg raising.       Assessment & Plan:  Sacroiliitis (HCC) Recommend heat, stretching and 2 Aleve twice per day for the next 2 weeks.  If continued difficulty she will return here.

## 2017-04-08 NOTE — Patient Instructions (Signed)
Heat for 20 minutes 3 times per day.  Gentle stretching after that and go ahead and use 2 Aleve twice per day

## 2017-04-25 ENCOUNTER — Encounter: Payer: Self-pay | Admitting: Medical

## 2017-04-25 ENCOUNTER — Ambulatory Visit (INDEPENDENT_AMBULATORY_CARE_PROVIDER_SITE_OTHER): Payer: Managed Care, Other (non HMO) | Admitting: Medical

## 2017-04-25 VITALS — BP 124/80 | HR 81 | Wt 241.0 lb

## 2017-04-25 DIAGNOSIS — M545 Low back pain, unspecified: Secondary | ICD-10-CM

## 2017-04-25 DIAGNOSIS — R8290 Unspecified abnormal findings in urine: Secondary | ICD-10-CM | POA: Diagnosis not present

## 2017-04-25 DIAGNOSIS — R1011 Right upper quadrant pain: Secondary | ICD-10-CM | POA: Insufficient documentation

## 2017-04-25 LAB — POCT URINE PREGNANCY: PREG TEST UR: NEGATIVE

## 2017-04-25 LAB — POCT URINALYSIS DIP (PROADVANTAGE DEVICE)
Blood, UA: NEGATIVE
Glucose, UA: NEGATIVE mg/dL
Ketones, POC UA: NEGATIVE mg/dL
Nitrite, UA: NEGATIVE
PH UA: 6 (ref 5.0–8.0)
PROTEIN UA: NEGATIVE mg/dL
Specific Gravity, Urine: 1.03
Urobilinogen, Ur: NEGATIVE

## 2017-04-25 NOTE — Progress Notes (Signed)
Subjective: Chief Complaint  Patient presents with  . lower back pain    lower back pain  x2 week  using heat  not helping with pain    Here for back pain.   Started out of the blue, mostly left low back. Saw Dr. Susann GivensLalonde here for this a few weeks ago.   Has been using aleve daily, stretching, and heat.   Not seeing improvement.    No injury, no fall, no trauma.   Back pain is constant.  It was more intermittent prior but not more constant.    Not taking Tramadol or Mobic prescribed last visit.   Is not currently taking birth control.  Is sexually active  Does get some RUQ abdominal pain from time to time for past month.  Does get pain after eating sometimes ,sometimes nausea.   Exercise - in general stretches, does walk for exercise at least an hour daily.   No strength training but wants to get back into this.    No fever, no urinary frequency, no blood in urine, no odor in urine.     Past Medical History:  Diagnosis Date  . Costochondritis 2014  . 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 Seasonal allergies    pollen, mold  . Shellfish allergy    Current Outpatient Medications on File Prior to Visit  Medication Sig Dispense Refill  . ALPRAZolam (XANAX) 0.25 MG tablet Take 1 tablet (0.25 mg total) by mouth 2 (two) times daily as needed for anxiety. 10 tablet 0  . meloxicam (MOBIC) 15 MG tablet Take 1 tablet (15 mg total) daily by mouth. (Patient not taking: Reported on 04/25/2017) 20 tablet 0  . traMADol (ULTRAM) 50 MG tablet Take 1 tablet (50 mg total) every 6 (six) hours as needed by mouth. (Patient not taking: Reported on 04/25/2017) 15 tablet 0  . TRI-PREVIFEM 0.18/0.215/0.25 MG-35 MCG tablet Take 1 tablet by mouth daily.  1   No current facility-administered medications on file prior to visit.    ROS as in subjective   Objective: BP 124/80   Pulse 81   Wt 241 lb (109.3 kg)   SpO2 96%   BMI 44.08 kg/m   Wt Readings from Last 3 Encounters:  04/25/17 241 lb (109.3 kg)  04/08/17 236  lb 12.8 oz (107.4 kg)  01/25/17 200 lb (90.7 kg)   General appearance: alert, no distress, WD/WN, obese AA female Abdomen: +bs, soft, mild RUQ and RLQ abdominal tenderness, otherwise non tender, non distended, no masses, no hepatomegaly, no splenomegaly Pulses: 2+ symmetric, upper and lower extremities, normal cap refill Ext: no edema Legs neurovascularly intact Tender left lumbar paraspinal region, but full ROM with mild pain, no deformity Mild pain in left low back with hip ROM which is full without pain.  Legs otherwise non tender and normal ROM    Assessment: Encounter Diagnoses  Name Primary?  . Acute left-sided low back pain without sciatica Yes  . RUQ abdominal pain     Plan: Low back pain - UA reviewed, Urine pregnancy negative.  We discussed possible diagnoses, will send for x-ray lumbar spine. Consider physical therapy referral.  We discussed need to have a daily exercise regimen, daily stretching, work on losing weight.  Follow-up pending x-ray  RUQ pain - discussed possible mild gall bladder inflammation.  Discussed triggers for cholecystis, advised low fat diet, small portions, exercise, weight loss.   F/u if symptoms persist or worsen  Onedia was seen today for lower back pain.  Diagnoses and all orders for this visit:  Acute left-sided low back pain without sciatica -     DG Lumbar Spine Complete; Future -     POCT urine pregnancy  RUQ abdominal pain

## 2017-04-27 ENCOUNTER — Other Ambulatory Visit: Payer: Self-pay | Admitting: Medical

## 2017-04-27 LAB — URINE CULTURE

## 2017-04-27 MED ORDER — AMOXICILLIN 500 MG PO TABS: 500.0000 mg | ORAL_TABLET | Freq: Three times a day (TID) | ORAL | 0 refills | Status: AC

## 2017-04-28 NOTE — Addendum Note (Signed)
Addended by: Winn JockVALENTINE, Fiore Detjen N on: 04/28/2017 02:36 PM   Modules accepted: Orders

## 2018-01-21 ENCOUNTER — Other Ambulatory Visit: Payer: Self-pay

## 2018-01-21 ENCOUNTER — Emergency Department (INDEPENDENT_AMBULATORY_CARE_PROVIDER_SITE_OTHER)
Admission: EM | Admit: 2018-01-21 | Discharge: 2018-01-21 | Disposition: A | Discharge status: Home / Self Care | Payer: Managed Care, Other (non HMO) | Source: Home / Self Care | Attending: Family Medicine | Admitting: Family Medicine

## 2018-01-21 ENCOUNTER — Encounter: Payer: Self-pay | Admitting: Emergency Medicine

## 2018-01-21 DIAGNOSIS — N3001 Acute cystitis with hematuria: Secondary | ICD-10-CM | POA: Diagnosis not present

## 2018-01-21 LAB — POCT URINALYSIS DIP (MANUAL ENTRY)
Bilirubin, UA: NEGATIVE
GLUCOSE UA: NEGATIVE mg/dL
Ketones, POC UA: NEGATIVE mg/dL
NITRITE UA: NEGATIVE
PROTEIN UA: NEGATIVE mg/dL
Spec Grav, UA: 1.02 (ref 1.010–1.025)
Urobilinogen, UA: 0.2 E.U./dL
pH, UA: 6.5 (ref 5.0–8.0)

## 2018-01-21 LAB — POCT URINE PREGNANCY: Preg Test, Ur: NEGATIVE

## 2018-01-21 MED ORDER — CEPHALEXIN 500 MG PO CAPS: 500.0000 mg | ORAL_CAPSULE | Freq: Two times a day (BID) | ORAL | 0 refills | Status: AC

## 2018-01-21 NOTE — Discharge Instructions (Signed)
°  You may try over the counter medication called Azo to help with bladder spasms.  This medication can make your urine orange, which is normal.  ° °Please take your antibiotic as prescribed. A urine culture has been sent to check the severity of your urinary infection and to determine if you are on the most appropriate antibiotic. The results should come back within 2-3 days and you will be notified even if no medication change is needed. ° °Please stay well hydrated and follow up with your family doctor in 1 week if not improving, sooner if worsening. ° ° °

## 2018-01-21 NOTE — ED Triage Notes (Signed)
Here with severe lower abdominal/pelvic pain x2 days. Sharp,achy constant pain noted. Denies vag d/c, fever,chills. LMP in July. Hx irregular menstruals. Also reports bladder pressure post void. 6/10 pain

## 2018-01-21 NOTE — ED Provider Notes (Signed)
[facility]    CSN: [id number] Arrival date & time: 01/21/18  1514     History   Chief Complaint Chief Complaint  Patient presents with  . Abdominal Pain    UTI sx's    HPI Deborah Woods is a 22 y.o. female.   HPI Deborah Woods is a 22 y.o. female presenting to UC with c/o severe lower abdominal pain and pelvic pain for 2 days. Sharp aching constant pain, worse after voiding. Pain is 6/10.  Denies fever, chills, n/v/d. No vaginal symptoms. She has been taking Tylenol with mild relief. Last UTI was about 1 year ago.  LMP in July, hx of irregular menses.    Past Medical History:  Diagnosis Date  . Costochondritis 2014  . 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 Seasonal allergies    pollen, mold  . Shellfish allergy     Patient Active Problem List   Diagnosis Date Noted  . Acute left-sided low back pain without sciatica 04/25/2017  . RUQ abdominal pain 04/25/2017  . 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 Acute non-recurrent sinusitis 02/02/2016  . Gastroesophageal reflux disease without esophagitis 02/02/2016  . Irregular periods 02/02/2016    History reviewed. No pertinent surgical history.  OB History   None      Home Medications    Prior to Admission medications   Medication Sig Start Date End Date Taking? Authorizing Provider  ALPRAZolam (XANAX) 0.25 MG tablet Take 1 tablet (0.25 mg total) by mouth 2 (two) times daily as needed for anxiety. 12/16/16   Henson, Vickie L, NP-C  amoxicillin (AMOXIL) 500 MG tablet Take 1 tablet (500 mg total) by mouth 3 (three) times daily. 04/27/17   Tysinger, Kermit Balo, PA-C  cephALEXin (KEFLEX) 500 MG capsule Take 1 capsule (500 mg total) by mouth 2 (two) times daily. 01/21/18   Lurene Shadow, PA-C  meloxicam (MOBIC) 15 MG tablet Take 1 tablet (15 mg total) daily by mouth. Patient not taking: Reported on 04/25/2017 01/25/17   Jaynie Crumble, PA-C  traMADol (ULTRAM) 50 MG tablet Take 1 tablet (50 mg total) every 6 (six) hours as  needed by mouth. Patient not taking: Reported on 04/25/2017 01/25/17   Jaynie Crumble, PA-C  TRI-PREVIFEM 0.18/0.215/0.25 MG-35 MCG tablet Take 1 tablet by mouth daily. 10/04/16   [provider]    Family History Family History  Problem Relation Age of Onset  . Heart disease Neg Hx   . Hypertension Neg Hx     Social History Social History   Tobacco Use  . Smoking status: Never Smoker  . Smokeless tobacco: Never Used  Substance Use Topics  . Alcohol use: No  . Drug use: No     Allergies   Shellfish allergy   Review of Systems Review of Systems  Constitutional: Negative for chills and fever.  Gastrointestinal: Positive for abdominal pain. Negative for diarrhea, nausea and vomiting.  Genitourinary: Positive for dysuria, frequency, pelvic pain and urgency.  Musculoskeletal: Negative for back pain.  Neurological: Negative for dizziness and headaches.     Physical Exam Triage Vital Signs ED Triage Vitals  Enc Vitals Group     BP 01/21/18 1540 136/83     Pulse Rate 01/21/18 1540 100     Resp --      Temp 01/21/18 1540 99.2 F (37.3 C)     Temp Source 01/21/18 1540 Oral     SpO2 01/21/18 1540 97 %     Weight 01/21/18 1541 242 lb 1.9  oz (109.8 kg)     Height 01/21/18 1541 5\' 2"  (1.575 m)     Head Circumference --      Peak Flow --      Pain Score 01/21/18 1540 6     Pain Loc --      Pain Edu? --      Excl. in GC? --    No data found.  Updated Vital Signs BP 136/83 (BP Location: Left Arm)   Pulse 100   Temp 99.2 F (37.3 C) (Oral)   Ht 5\' 2"  (1.575 m)   Wt 242 lb 1.9 oz (109.8 kg)   LMP 09/20/2017 (Approximate)   SpO2 97%   BMI 44.28 kg/m   Visual Acuity Right Eye Distance:   Left Eye Distance:   Bilateral Distance:    Right Eye Near:   Left Eye Near:    Bilateral Near:     Physical Exam  Constitutional: She is oriented to person, place, and time. She appears well-developed and well-nourished.  HENT:  Head: Normocephalic and  atraumatic.  Eyes: EOM are normal.  Neck: Normal range of motion.  Cardiovascular: Normal rate and regular rhythm.  Pulmonary/Chest: Effort normal and breath sounds normal. No respiratory distress.  Abdominal: Soft. Normal appearance. There is tenderness in the suprapubic area. There is no rigidity, no rebound, no guarding and no CVA tenderness.  Musculoskeletal: Normal range of motion.  Neurological: She is alert and oriented to person, place, and time.  Skin: Skin is warm and dry.  Psychiatric: She has a normal mood and affect. Her behavior is normal.  Nursing note and vitals reviewed.    UC Treatments / Results  Labs (all labs ordered are listed, but only abnormal results are displayed) Labs Reviewed  POCT URINALYSIS DIP (MANUAL ENTRY) - Abnormal; Notable for the following components:      Result Value   Clarity, UA hazy (*)    Blood, UA trace-lysed (*)    Leukocytes, UA Small (1+) (*)    All other components within normal limits  POCT URINE PREGNANCY    EKG None  Radiology No results found.  Procedures Procedures (including critical care time)  Medications Ordered in UC Medications - No data to display  Initial Impression / Assessment and Plan / UC Course  I have reviewed the triage vital signs and the nursing notes.  Pertinent labs & imaging results that were available during my care of the patient were reviewed by me and considered in my medical decision making (see chart for details).     Hx and UA c/w UTI Urine culture sent Home care info provided.  Final Clinical Impressions(s) / UC Diagnoses   Final diagnoses:  Acute cystitis with hematuria     Discharge Instructions      You may try over the counter medication called Azo to help with bladder spasms.  This medication can make your urine orange, which is normal.   Please take your antibiotic as prescribed. A urine culture has been sent to check the severity of your urinary infection and to  determine if you are on the most appropriate antibiotic. The results should come back within 2-3 days and you will be notified even if no medication change is needed.  Please stay well hydrated and follow up with your family doctor in 1 week if not improving, sooner if worsening.     ED Prescriptions    Medication Sig Dispense Auth. Provider   cephALEXin (KEFLEX) 500 MG capsule Take  1 capsule (500 mg total) by mouth 2 (two) times daily. 14 capsule Lurene Shadow, PA-C     Controlled Substance Prescriptions [place] Controlled Substance Registry consulted? Not Applicable   Lurene Shadow, PA-C 01/21/18 1606

## 2018-01-24 ENCOUNTER — Telehealth: Payer: Self-pay

## 2018-01-24 NOTE — Telephone Encounter (Signed)
Left message on VM to call if any questions or concerns.  Urine culture sent was not able to be processed, and if still symptomatic, needs to come in to have a culture sent.

## 2018-01-25 LAB — URINE CULTURE

## 2018-05-04 ENCOUNTER — Telehealth: Payer: Self-pay | Admitting: Medical

## 2018-05-04 NOTE — Telephone Encounter (Signed)
Dismissal letter in guarantor snapshot  °

## 2018-05-25 IMAGING — CR DG CHEST 2V
2 series · 2 of 2 positions shown · non-contrast
Comparison: None.

CLINICAL DATA: Upper and mid chest pain for 48 hours, patient
states she has had a cough and cold for one week, shortness of
breath

EXAM:
CHEST  2 VIEW

[w chest pa]
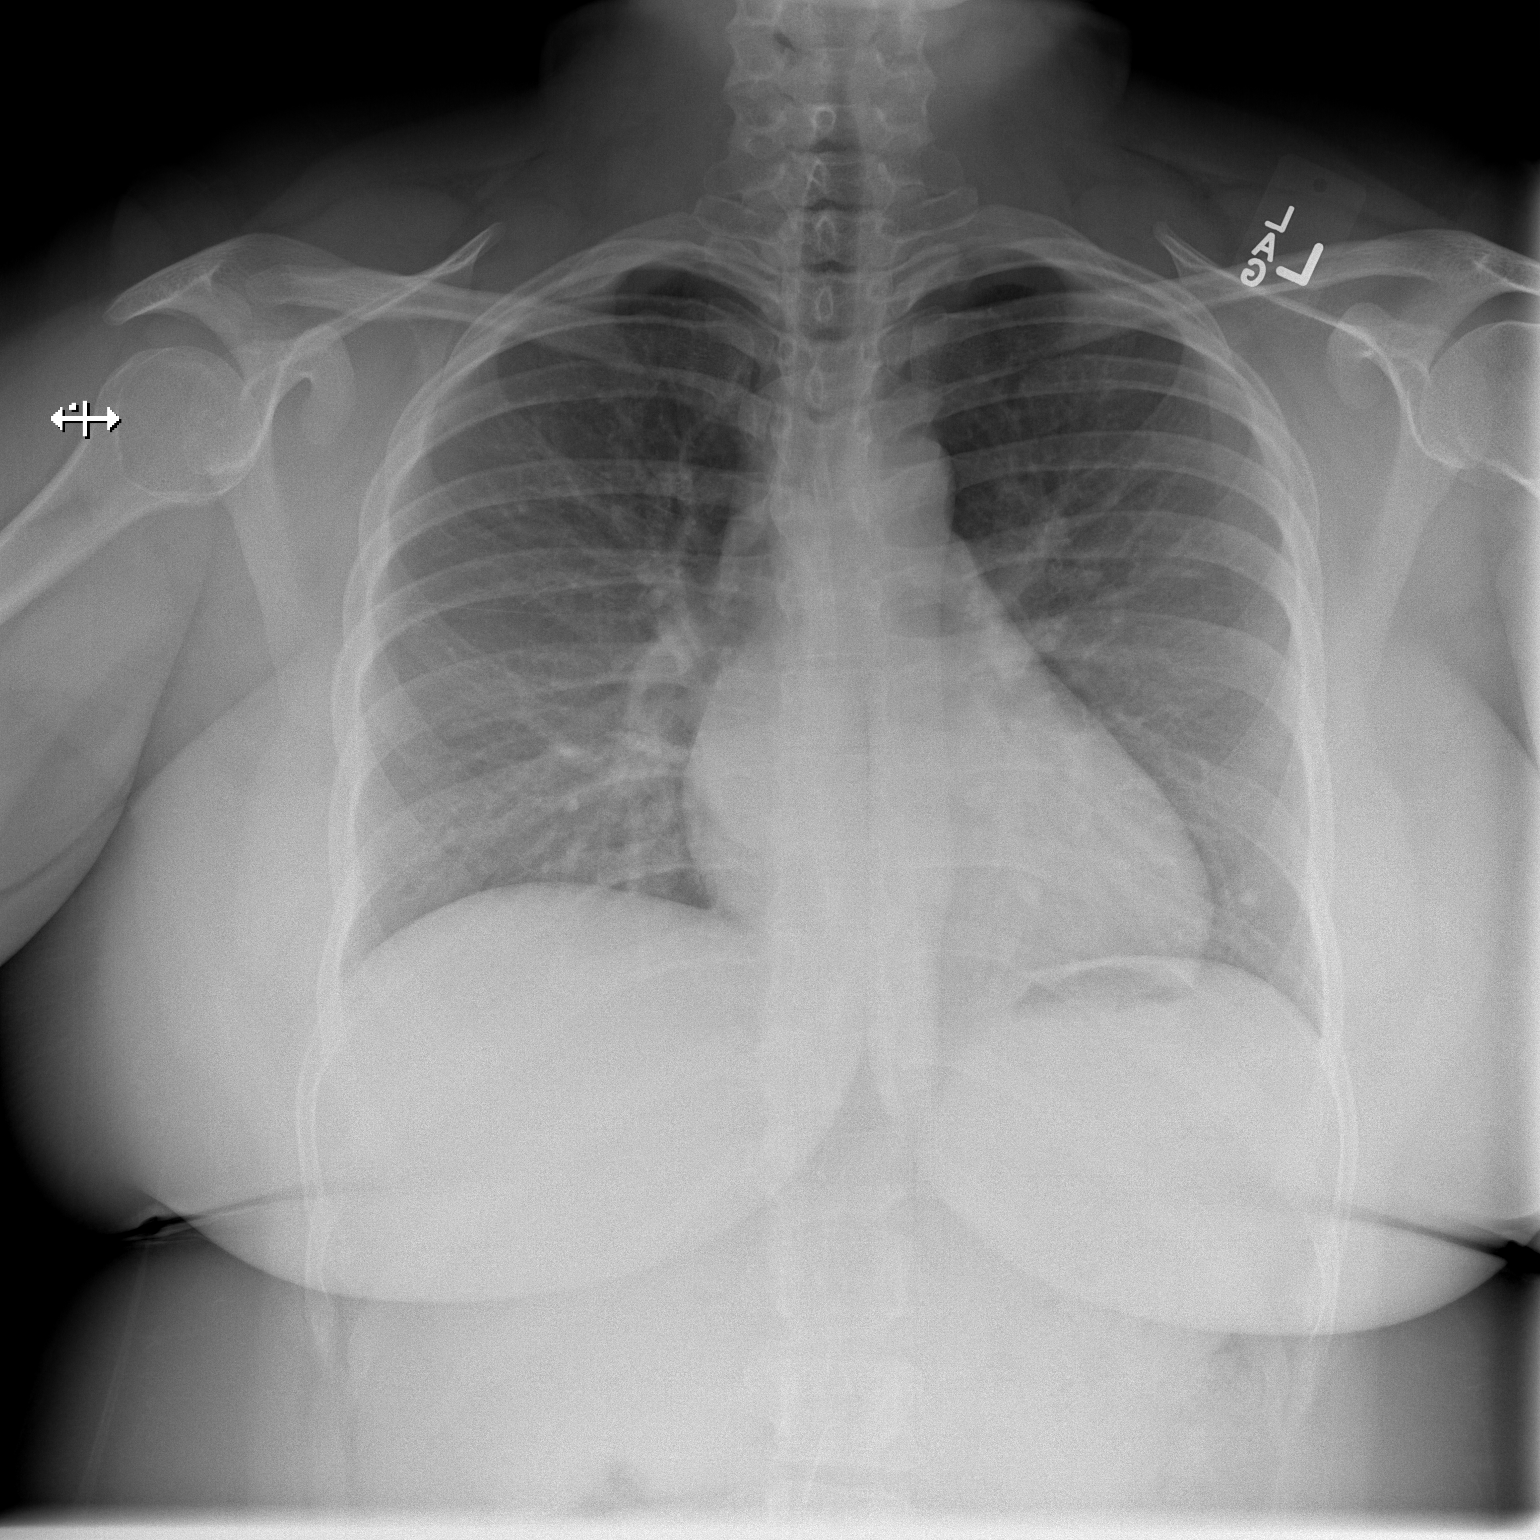

[w chest lat]
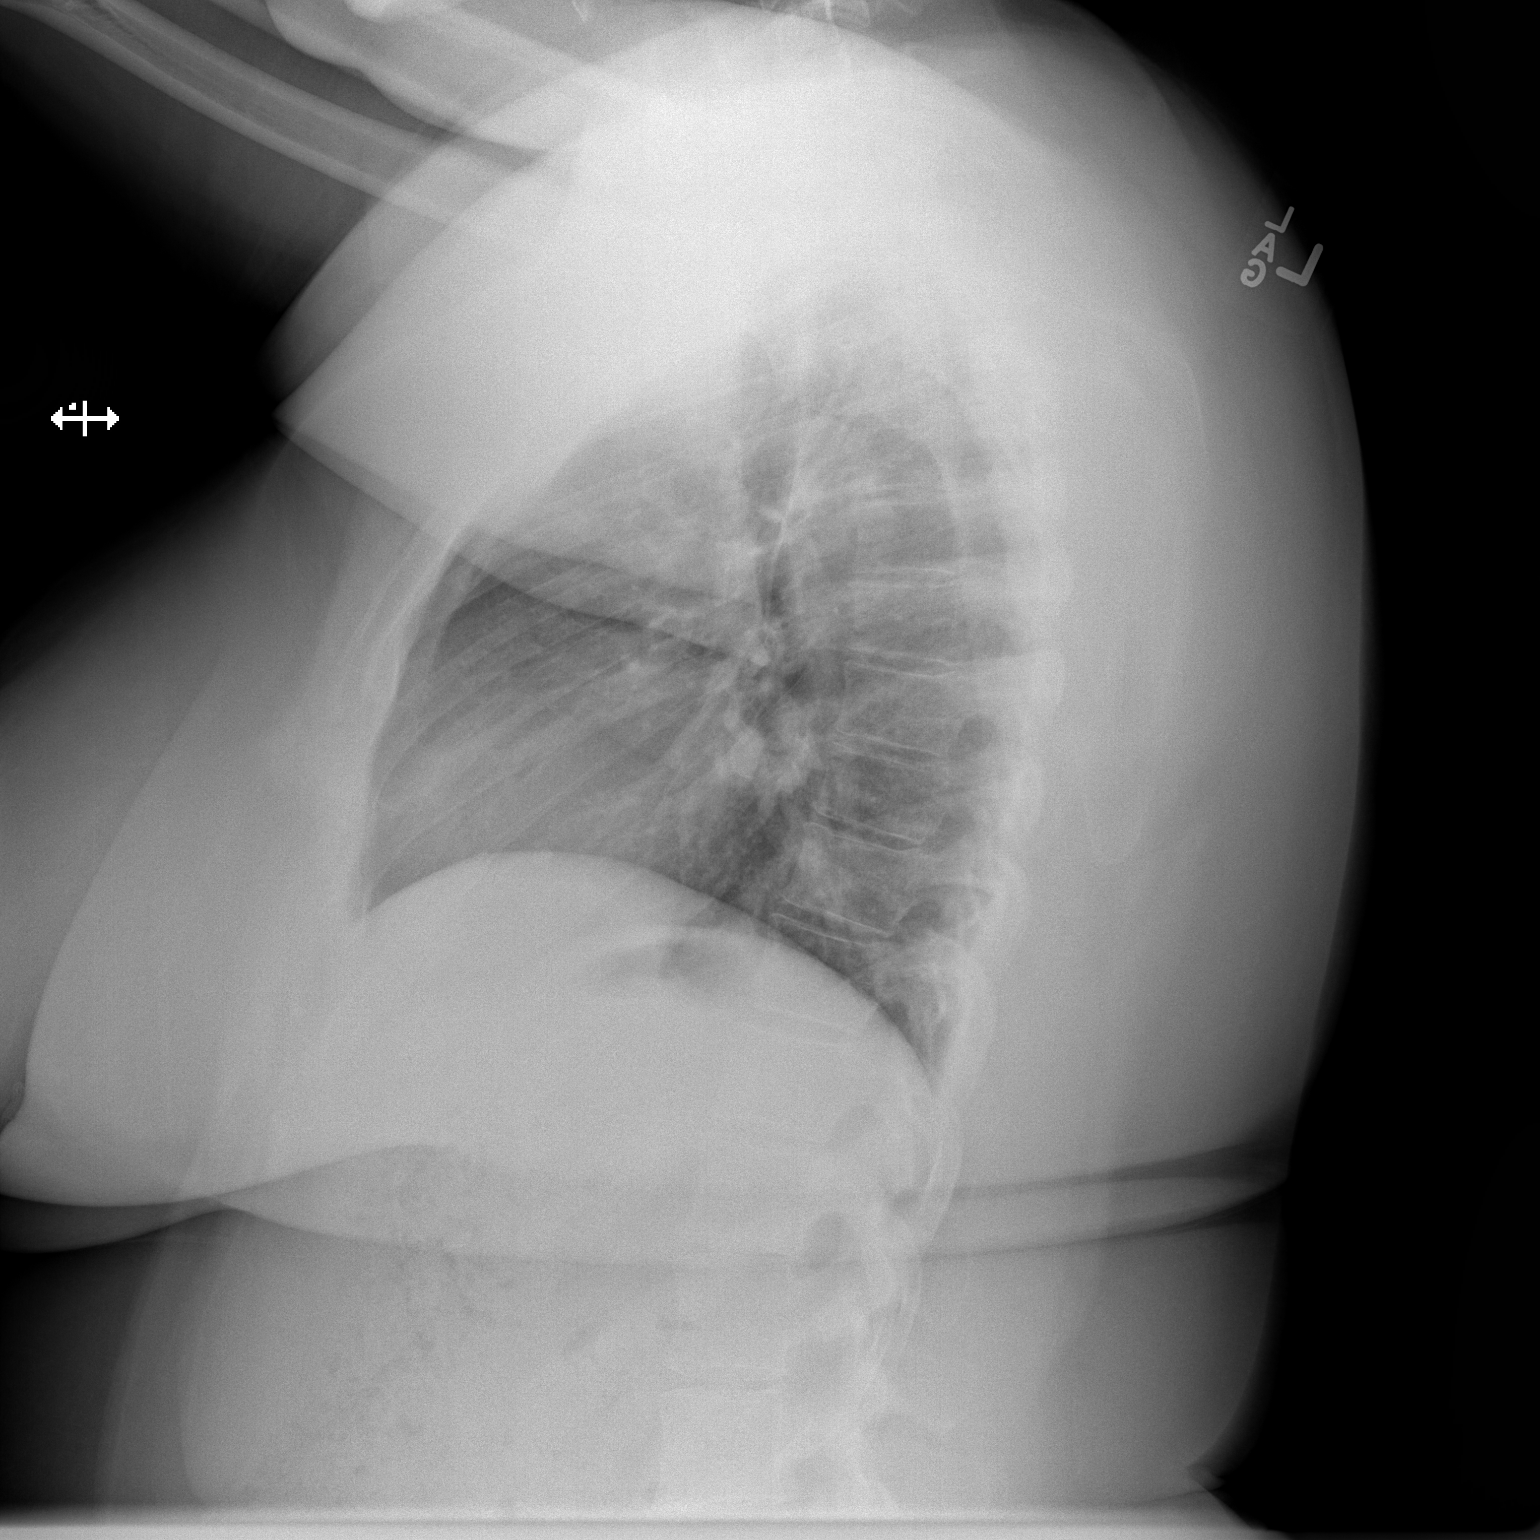

[2 of 2 positions shown; findings below may reference images not displayed]

FINDINGS: The heart size and mediastinal contours are within normal limits.
Both lungs are clear. Calcified granuloma in the left base. The
visualized skeletal structures are unremarkable.
IMPRESSION: No active cardiopulmonary disease.
